# Patient Record
Sex: Female | Born: 1950 | Race: White | Hispanic: No | Marital: Married | Smoking: Never smoker
Health system: Southern US, Community
[De-identification: ages and names within clinical notes are randomized; demographics above are authoritative.]

## PROBLEM LIST (undated history)

## (undated) DIAGNOSIS — H269 Unspecified cataract: Secondary | ICD-10-CM

## (undated) DIAGNOSIS — E785 Hyperlipidemia, unspecified: Secondary | ICD-10-CM

## (undated) DIAGNOSIS — M8580 Other specified disorders of bone density and structure, unspecified site: Secondary | ICD-10-CM

## (undated) DIAGNOSIS — F329 Major depressive disorder, single episode, unspecified: Secondary | ICD-10-CM

## (undated) DIAGNOSIS — F32A Depression, unspecified: Secondary | ICD-10-CM

## (undated) DIAGNOSIS — D649 Anemia, unspecified: Secondary | ICD-10-CM

## (undated) DIAGNOSIS — T7840XA Allergy, unspecified, initial encounter: Secondary | ICD-10-CM

## (undated) HISTORY — DX: Other specified disorders of bone density and structure, unspecified site: M85.80

## (undated) HISTORY — PX: COLONOSCOPY: SHX174

## (undated) HISTORY — DX: Unspecified cataract: H26.9

## (undated) HISTORY — PX: DILATION AND CURETTAGE OF UTERUS: SHX78

## (undated) HISTORY — DX: Anemia, unspecified: D64.9

## (undated) HISTORY — DX: Allergy, unspecified, initial encounter: T78.40XA

## (undated) HISTORY — DX: Major depressive disorder, single episode, unspecified: F32.9

## (undated) HISTORY — DX: Depression, unspecified: F32.A

## (undated) HISTORY — DX: Hyperlipidemia, unspecified: E78.5

---

## 1999-04-21 ENCOUNTER — Ambulatory Visit (HOSPITAL_COMMUNITY): Admission: RE | Admit: 1999-04-21 | Discharge: 1999-04-21 | Payer: Self-pay | Admitting: Obstetrics and Gynecology

## 1999-04-21 ENCOUNTER — Encounter (INDEPENDENT_AMBULATORY_CARE_PROVIDER_SITE_OTHER): Payer: Self-pay

## 2000-02-11 ENCOUNTER — Other Ambulatory Visit: Admission: RE | Admit: 2000-02-11 | Discharge: 2000-02-11 | Payer: Self-pay | Admitting: Obstetrics & Gynecology

## 2001-03-08 ENCOUNTER — Other Ambulatory Visit: Admission: RE | Admit: 2001-03-08 | Discharge: 2001-03-08 | Payer: Self-pay | Admitting: Obstetrics & Gynecology

## 2002-03-09 ENCOUNTER — Other Ambulatory Visit: Admission: RE | Admit: 2002-03-09 | Discharge: 2002-03-09 | Payer: Self-pay | Admitting: Obstetrics & Gynecology

## 2003-03-19 ENCOUNTER — Other Ambulatory Visit: Admission: RE | Admit: 2003-03-19 | Discharge: 2003-03-19 | Payer: Self-pay | Admitting: Obstetrics & Gynecology

## 2006-04-13 ENCOUNTER — Ambulatory Visit: Payer: Self-pay | Admitting: Gastroenterology

## 2006-04-28 ENCOUNTER — Ambulatory Visit: Payer: Self-pay | Admitting: Gastroenterology

## 2007-01-25 ENCOUNTER — Encounter: Admission: RE | Admit: 2007-01-25 | Discharge: 2007-01-25 | Payer: Self-pay | Admitting: Obstetrics & Gynecology

## 2011-03-26 ENCOUNTER — Encounter: Payer: Self-pay | Admitting: Internal Medicine

## 2011-06-09 ENCOUNTER — Ambulatory Visit (AMBULATORY_SURGERY_CENTER): Payer: BC Managed Care – PPO | Admitting: *Deleted

## 2011-06-09 ENCOUNTER — Encounter: Payer: Self-pay | Admitting: Gastroenterology

## 2011-06-09 VITALS — Ht 63.0 in | Wt 141.0 lb

## 2011-06-09 DIAGNOSIS — Z1211 Encounter for screening for malignant neoplasm of colon: Secondary | ICD-10-CM

## 2011-06-09 MED ORDER — MOVIPREP 100 G PO SOLR: ORAL | Status: DC

## 2011-06-21 ENCOUNTER — Ambulatory Visit (AMBULATORY_SURGERY_CENTER): Payer: BC Managed Care – PPO | Admitting: Gastroenterology

## 2011-06-21 ENCOUNTER — Encounter: Payer: Self-pay | Admitting: Gastroenterology

## 2011-06-21 VITALS — BP 125/76 | HR 67 | Temp 97.3°F | Resp 18 | Ht 63.0 in | Wt 141.0 lb

## 2011-06-21 DIAGNOSIS — Z800 Family history of malignant neoplasm of digestive organs: Secondary | ICD-10-CM | POA: Insufficient documentation

## 2011-06-21 DIAGNOSIS — Z1211 Encounter for screening for malignant neoplasm of colon: Secondary | ICD-10-CM

## 2011-06-21 MED ORDER — SODIUM CHLORIDE 0.9 % IV SOLN: 500.0000 mL | INTRAVENOUS | Status: DC

## 2011-06-21 NOTE — Progress Notes (Signed)
Shon Hough, CRNA reported slight blood tinged saliva on the white towel on the side of the pt's mouth when brought into the recovery room.  I also assessed the pt's mouth and she had a small bite on the inside of her right cheek were the pt must had bitten in the procedure room.  No bleeding noted at this time and the pt denies any pain or discomfort.  Her husband was also notififed as well. Maw  No complaints noted in the recovery room. Maw  Patient did not experience any of the following events: a burn prior to discharge; a fall within the facility; wrong site/side/patient/procedure/implant event; or a hospital transfer or hospital admission upon discharge from the facility. [phone]) Patient did not have preoperative order for IV antibiotic SSI prophylaxis. [phone])   No bleeding or blood tinged mucous noted on discharge. Maw

## 2011-06-21 NOTE — Patient Instructions (Signed)
Resume your prior medications today.  Please call if any questions or concerns.   YOU HAD AN ENDOSCOPIC PROCEDURE TODAY AT THE [facility]: Refer to the procedure report that was given to you for any specific questions about what was found during the examination.  If the procedure report does not answer your questions, please call your gastroenterologist to clarify.  If you requested that your care partner not be given the details of your procedure findings, then the procedure report has been included in a sealed envelope for you to review at your convenience later.  YOU SHOULD EXPECT: Some feelings of bloating in the abdomen. Passage of more gas than usual.  Walking can help get rid of the air that was put into your GI tract during the procedure and reduce the bloating. If you had a lower endoscopy (such as a colonoscopy or flexible sigmoidoscopy) you may notice spotting of blood in your stool or on the toilet paper. If you underwent a bowel prep for your procedure, then you may not have a normal bowel movement for a few days.  DIET: Your first meal following the procedure should be a light meal and then it is ok to progress to your normal diet.  A half-sandwich or bowl of soup is an example of a good first meal.  Heavy or fried foods are harder to digest and may make you feel nauseous or bloated.  Likewise meals heavy in dairy and vegetables can cause extra gas to form and this can also increase the bloating.  Drink plenty of fluids but you should avoid alcoholic beverages for 24 hours.  ACTIVITY: Your care partner should take you home directly after the procedure.  You should plan to take it easy, moving slowly for the rest of the day.  You can resume normal activity the day after the procedure however you should NOT DRIVE or use heavy machinery for 24 hours (because of the sedation medicines used during the test).    SYMPTOMS TO REPORT IMMEDIATELY: A gastroenterologist can be reached at  any hour.  During normal business hours, 8:30 AM to 5:00 PM Monday through Friday, call [phone].  After hours and on weekends, please call the GI answering service at [phone] who will take a message and have the physician on call contact you.   Following lower endoscopy (colonoscopy or flexible sigmoidoscopy):  Excessive amounts of blood in the stool  Significant tenderness or worsening of abdominal pains  Swelling of the abdomen that is new, acute  Fever of 100F or higher    FOLLOW UP: If any biopsies were taken you will be contacted by phone or by letter within the next 1-3 weeks.  Call your gastroenterologist if you have not heard about the biopsies in 3 weeks.  Our staff will call the home number listed on your records the next business day following your procedure to check on you and address any questions or concerns that you may have at that time regarding the information given to you following your procedure. This is a courtesy call and so if there is no answer at the home number and we have not heard from you through the emergency physician on call, we will assume that you have returned to your regular daily activities without incident.  SIGNATURES/CONFIDENTIALITY: You and/or your care partner have signed paperwork which will be entered into your electronic medical record.  These signatures attest to the fact that that the information above on your   After Visit Summary has been reviewed and is understood.  Full responsibility of the confidentiality of this discharge information lies with you and/or your care-partner.  

## 2011-06-21 NOTE — Op Note (Signed)
[facility] [address] [place], [place]  [address]  COLONOSCOPY PROCEDURE REPORT  PATIENT:  Angela Hickman, Angela Hickman  MR#:  [id number] BIRTHDATE:  06/24/1950, 61 yrs. old  GENDER:  female ENDOSCOPIST:  Vania Rea. Jarold Motto, MD, [facility] REF. BY: PROCEDURE DATE:  06/21/2011 PROCEDURE:  Surveillance Colonoscopy ASA CLASS:  Class II INDICATIONS:  family history of colon cancer MEDICATIONS:   propofol (Diprivan) 160 mg IV  DESCRIPTION OF PROCEDURE:   After the risks and benefits and of the procedure were explained, informed consent was obtained. Digital rectal exam was performed and revealed no abnormalities. The LB PCF-[id number] endoscope was introduced through the anus and advanced to the cecum, which was identified by both the appendix and ileocecal valve.  The quality of the prep was excellent, using MoviPrep.  The instrument was then slowly withdrawn as the colon was fully examined. <<PROCEDUREIMAGES>>  FINDINGS:  No polyps or cancers were seen. very tortuous and redundant colon.hard exam  This was otherwise a normal examination of the colon.   Retroflexed views in the rectum revealed no abnormalities.    The scope was then withdrawn from the patient and the procedure completed.  COMPLICATIONS:  None ENDOSCOPIC IMPRESSION: 1) No polyps or cancers 2) Otherwise normal examination RECOMMENDATIONS: 1) Given your significant family history of colon cancer, you should have a repeat colonoscopy in 5 years  REPEAT EXAM:  No  ______________________________ Vania Rea. Jarold Motto, MD, [facility]  CC:  Shaune Pollack, MD  n. Rosalie Doctor:   Vania Rea. Trevonn Hallum at 06/21/2011 10:02 AM  [facility], Corrie Dandy, [id number]

## 2011-06-22 ENCOUNTER — Telehealth: Payer: Self-pay | Admitting: *Deleted

## 2011-06-22 NOTE — Telephone Encounter (Signed)
Called number given in admitting yesterday and no answer with no answering machine. ewm

## 2012-07-05 ENCOUNTER — Other Ambulatory Visit (HOSPITAL_COMMUNITY): Payer: Self-pay | Admitting: Urology

## 2012-07-05 DIAGNOSIS — N1330 Unspecified hydronephrosis: Secondary | ICD-10-CM

## 2012-08-15 ENCOUNTER — Encounter (HOSPITAL_COMMUNITY)
Admission: RE | Admit: 2012-08-15 | Discharge: 2012-08-15 | Disposition: A | Discharge status: Home / Self Care | Payer: BC Managed Care – PPO | Source: Ambulatory Visit | Attending: Urology | Admitting: Urology

## 2012-08-15 DIAGNOSIS — N1330 Unspecified hydronephrosis: Secondary | ICD-10-CM

## 2012-08-15 MED ORDER — FUROSEMIDE 10 MG/ML IJ SOLN
40.0000 mg | Freq: Once | INTRAMUSCULAR | Status: AC
  Administered 2012-08-15: 31 mg via INTRAVENOUS
  Filled 2012-08-15: qty 4

## 2012-08-15 MED ORDER — TECHNETIUM TC 99M MERTIATIDE
16.5000 | Freq: Once | INTRAVENOUS | Status: AC | PRN
  Administered 2012-08-15: 17 via INTRAVENOUS

## 2014-12-31 IMAGING — NM NM RENAL IMAGING FLOW W/ PHARM
2 series · 12 of 12 positions shown · non-contrast
Comparison: None.

CLINICAL DATA: Hydronephrosis.  Question obstruction of the left
ureter.

NUCLEAR MEDICINE RENAL SCAN WITH DIURETIC ADMINISTRATION
TECHNIQUE: Radionuclide angiographic and sequential renal images
were obtained after intravenous injection of radiopharmaceutical.
Imaging was continued during slow intravenous injection of Lasix
approximately 15 minutes after the start of the examination.
Radiopharmaceutical:  16.5 mCi Bc-ZZm MAG3

[Series 1: re renal qualitative · 9.51mm/px · 6 of 130 frames shown (1 of 2)]
[frame 11/130]
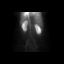
[frame 33/130]
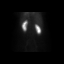
[frame 55/130]
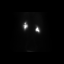
[frame 76/130]
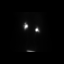
[frame 98/130]
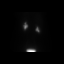
[frame 120/130]
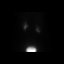

[Series 1: re renal qualitative · 9.51mm/px · 6 of 130 frames shown (2 of 2)]
[frame 11/130]
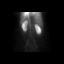
[frame 33/130]
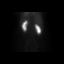
[frame 55/130]
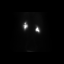
[frame 76/130]
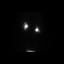
[frame 98/130]
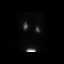
[frame 120/130]
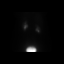

[12 of 12 positions shown; findings below may reference images not displayed]

FINDINGS: Perfusion images demonstrate early symmetric perfusion to
the kidneys.  Differential renal uptake at 2-3 minutes is 49% on
the right and 51% on the left.

Renogram images demonstrate normal renal cortical uptake and
excretion bilaterally.  Early excretory images demonstrate
symmetric activity in the renal pelves.  There is no ureteral
dilatation or retention of contrast within the collecting systems.
There is no evidence of ureteral obstruction.

Differential renal function is 49.1% right and 50.9% left.  Post
Lasix T [DATE] times are 8.5 minutes on the right and 7.0 minutes on
the left.
IMPRESSION: Normal examination.  No evidence of ureteral obstruction.  The
differential renal function is nearly symmetric.

## 2015-03-18 ENCOUNTER — Encounter: Payer: Self-pay | Admitting: Gastroenterology

## 2015-08-26 DIAGNOSIS — Z1231 Encounter for screening mammogram for malignant neoplasm of breast: Secondary | ICD-10-CM | POA: Diagnosis not present

## 2015-11-04 DIAGNOSIS — Z6826 Body mass index (BMI) 26.0-26.9, adult: Secondary | ICD-10-CM | POA: Diagnosis not present

## 2015-11-04 DIAGNOSIS — Z01419 Encounter for gynecological examination (general) (routine) without abnormal findings: Secondary | ICD-10-CM | POA: Diagnosis not present

## 2015-11-26 DIAGNOSIS — N3090 Cystitis, unspecified without hematuria: Secondary | ICD-10-CM | POA: Diagnosis not present

## 2015-12-01 DIAGNOSIS — E7800 Pure hypercholesterolemia, unspecified: Secondary | ICD-10-CM | POA: Diagnosis not present

## 2015-12-01 DIAGNOSIS — R310 Gross hematuria: Secondary | ICD-10-CM | POA: Diagnosis not present

## 2015-12-01 DIAGNOSIS — Z79899 Other long term (current) drug therapy: Secondary | ICD-10-CM | POA: Diagnosis not present

## 2015-12-22 DIAGNOSIS — N950 Postmenopausal bleeding: Secondary | ICD-10-CM | POA: Diagnosis not present

## 2016-03-29 ENCOUNTER — Encounter: Payer: Self-pay | Admitting: *Deleted

## 2016-05-12 ENCOUNTER — Encounter: Payer: Self-pay | Admitting: Gastroenterology

## 2016-08-30 DIAGNOSIS — Z1231 Encounter for screening mammogram for malignant neoplasm of breast: Secondary | ICD-10-CM | POA: Diagnosis not present

## 2016-09-30 DIAGNOSIS — R928 Other abnormal and inconclusive findings on diagnostic imaging of breast: Secondary | ICD-10-CM | POA: Diagnosis not present

## 2016-10-25 DIAGNOSIS — H2513 Age-related nuclear cataract, bilateral: Secondary | ICD-10-CM | POA: Diagnosis not present

## 2016-10-25 DIAGNOSIS — H5213 Myopia, bilateral: Secondary | ICD-10-CM | POA: Diagnosis not present

## 2016-10-25 DIAGNOSIS — H52203 Unspecified astigmatism, bilateral: Secondary | ICD-10-CM | POA: Diagnosis not present

## 2016-10-25 DIAGNOSIS — H524 Presbyopia: Secondary | ICD-10-CM | POA: Diagnosis not present

## 2016-10-27 DIAGNOSIS — L719 Rosacea, unspecified: Secondary | ICD-10-CM | POA: Diagnosis not present

## 2016-11-15 DIAGNOSIS — Z6826 Body mass index (BMI) 26.0-26.9, adult: Secondary | ICD-10-CM | POA: Diagnosis not present

## 2016-11-15 DIAGNOSIS — Z01419 Encounter for gynecological examination (general) (routine) without abnormal findings: Secondary | ICD-10-CM | POA: Diagnosis not present

## 2016-11-29 DIAGNOSIS — Z23 Encounter for immunization: Secondary | ICD-10-CM | POA: Diagnosis not present

## 2016-11-29 DIAGNOSIS — E7800 Pure hypercholesterolemia, unspecified: Secondary | ICD-10-CM | POA: Diagnosis not present

## 2016-11-29 DIAGNOSIS — Z5181 Encounter for therapeutic drug level monitoring: Secondary | ICD-10-CM | POA: Diagnosis not present

## 2016-12-14 ENCOUNTER — Ambulatory Visit (AMBULATORY_SURGERY_CENTER): Payer: Self-pay

## 2016-12-14 VITALS — Ht 62.5 in | Wt 149.0 lb

## 2016-12-14 DIAGNOSIS — Z800 Family history of malignant neoplasm of digestive organs: Secondary | ICD-10-CM

## 2016-12-14 MED ORDER — NA SULFATE-K SULFATE-MG SULF 17.5-3.13-1.6 GM/177ML PO SOLN: ORAL | 0 refills | Status: DC

## 2016-12-14 NOTE — Progress Notes (Signed)
Per pt, no allergies to soy or egg products.Pt not taking any weight loss meds or using  O2 at home.   Pt refused Emmi video. 

## 2016-12-15 DIAGNOSIS — M899 Disorder of bone, unspecified: Secondary | ICD-10-CM | POA: Diagnosis not present

## 2016-12-21 ENCOUNTER — Encounter: Payer: Self-pay | Admitting: Gastroenterology

## 2016-12-27 ENCOUNTER — Ambulatory Visit (AMBULATORY_SURGERY_CENTER): Payer: BLUE CROSS/BLUE SHIELD | Admitting: Gastroenterology

## 2016-12-27 ENCOUNTER — Encounter: Payer: Self-pay | Admitting: Gastroenterology

## 2016-12-27 VITALS — BP 118/71 | HR 68 | Temp 97.3°F | Resp 10 | Ht 62.5 in | Wt 149.0 lb

## 2016-12-27 DIAGNOSIS — D123 Benign neoplasm of transverse colon: Secondary | ICD-10-CM | POA: Diagnosis not present

## 2016-12-27 DIAGNOSIS — D125 Benign neoplasm of sigmoid colon: Secondary | ICD-10-CM

## 2016-12-27 DIAGNOSIS — Z1211 Encounter for screening for malignant neoplasm of colon: Secondary | ICD-10-CM

## 2016-12-27 DIAGNOSIS — Z800 Family history of malignant neoplasm of digestive organs: Secondary | ICD-10-CM | POA: Diagnosis not present

## 2016-12-27 DIAGNOSIS — D122 Benign neoplasm of ascending colon: Secondary | ICD-10-CM

## 2016-12-27 DIAGNOSIS — Z1212 Encounter for screening for malignant neoplasm of rectum: Secondary | ICD-10-CM | POA: Diagnosis not present

## 2016-12-27 MED ORDER — SODIUM CHLORIDE 0.9 % IV SOLN: 500.0000 mL | INTRAVENOUS | Status: DC

## 2016-12-27 NOTE — Progress Notes (Signed)
Report to PACU, RN, vss, BBS= Clear.  

## 2016-12-27 NOTE — Patient Instructions (Signed)
**   Handouts given on polyps and diverticulosis **   YOU HAD AN ENDOSCOPIC PROCEDURE TODAY AT THE [facility]:   Refer to the procedure report that was given to you for any specific questions about what was found during the examination.  If the procedure report does not answer your questions, please call your gastroenterologist to clarify.  If you requested that your care partner not be given the details of your procedure findings, then the procedure report has been included in a sealed envelope for you to review at your convenience later.  YOU SHOULD EXPECT: Some feelings of bloating in the abdomen. Passage of more gas than usual.  Walking can help get rid of the air that was put into your GI tract during the procedure and reduce the bloating. If you had a lower endoscopy (such as a colonoscopy or flexible sigmoidoscopy) you may notice spotting of blood in your stool or on the toilet paper. If you underwent a bowel prep for your procedure, you may not have a normal bowel movement for a few days.  Please Note:  You might notice some irritation and congestion in your nose or some drainage.  This is from the oxygen used during your procedure.  There is no need for concern and it should clear up in a day or so.  SYMPTOMS TO REPORT IMMEDIATELY:   Following lower endoscopy (colonoscopy or flexible sigmoidoscopy):  Excessive amounts of blood in the stool  Significant tenderness or worsening of abdominal pains  Swelling of the abdomen that is new, acute  Fever of 100F or higher  For urgent or emergent issues, a gastroenterologist can be reached at any hour by calling [phone].   DIET:  We do recommend a small meal at first, but then you may proceed to your regular diet.  Drink plenty of fluids but you should avoid alcoholic beverages for 24 hours.  ACTIVITY:  You should plan to take it easy for the rest of today and you should NOT DRIVE or use heavy machinery until tomorrow (because  of the sedation medicines used during the test).    FOLLOW UP: Our staff will call the number listed on your records the next business day following your procedure to check on you and address any questions or concerns that you may have regarding the information given to you following your procedure. If we do not reach you, we will leave a message.  However, if you are feeling well and you are not experiencing any problems, there is no need to return our call.  We will assume that you have returned to your regular daily activities without incident.  If any biopsies were taken you will be contacted by phone or by letter within the next 1-3 weeks.  Please call us at [phone] if you have not heard about the biopsies in 3 weeks.    SIGNATURES/CONFIDENTIALITY: You and/or your care partner have signed paperwork which will be entered into your electronic medical record.  These signatures attest to the fact that that the information above on your After Visit Summary has been reviewed and is understood.  Full responsibility of the confidentiality of this discharge information lies with you and/or your care-partner. 

## 2016-12-27 NOTE — Progress Notes (Signed)
Called to room to assist during endoscopic procedure.  Patient ID and intended procedure confirmed with present staff. Received instructions for my participation in the procedure from the performing physician.  

## 2016-12-27 NOTE — Op Note (Signed)
[facility] Patient Name: Angela Hickman Procedure Date: 12/27/2016 9:03 AM MRN: [id number] Endoscopist: Ladene Artist , MD Age: 66 Referring MD:  Date of Birth: 01/08/1951 Gender: Female Account #: [id number] Procedure:                Colonoscopy Indications:              Screening in patient at increased risk: Family                            history of 1st-degree relative with colorectal                            cancer Medicines:                Monitored Anesthesia Care Procedure:                Pre-Anesthesia Assessment:                           - Prior to the procedure, a History and Physical                            was performed, and patient medications and                            allergies were reviewed. The patient's tolerance of                            previous anesthesia was also reviewed. The risks                            and benefits of the procedure and the sedation                            options and risks were discussed with the patient.                            All questions were answered, and informed consent                            was obtained. Prior Anticoagulants: The patient has                            taken no previous anticoagulant or antiplatelet                            agents. ASA Grade Assessment: II - A patient with                            mild systemic disease. After reviewing the risks                            and benefits, the patient was deemed in  satisfactory condition to undergo the procedure.                           After obtaining informed consent, the colonoscope                            was passed under direct vision. Throughout the                            procedure, the patient's blood pressure, pulse, and                            oxygen saturations were monitored continuously. The                            Colonoscope was introduced through the anus and                       advanced to the the cecum, identified by                            appendiceal orifice and ileocecal valve. The                            ileocecal valve, appendiceal orifice, and rectum                            were photographed. The quality of the bowel                            preparation was excellent. The colonoscopy was                            performed without difficulty. The patient tolerated                            the procedure well. Scope In: 9:20:44 AM Scope Out: 9:38:52 AM Scope Withdrawal Time: 0 hours 15 minutes 17 seconds  Total Procedure Duration: 0 hours 18 minutes 8 seconds  Findings:                 The perianal and digital rectal examinations were                            normal.                           A 4 mm polyp was found in the transverse colon. The                            polyp was sessile. The polyp was removed with a                            cold biopsy forceps. Resection and retrieval were  complete.                           Two sessile polyps were found in the sigmoid colon                            and ascending colon. The polyps were 7 mm in size.                            These polyps were removed with a cold snare.                            Resection and retrieval were complete.                           A few scattered medium-mouthed diverticula were                            found in the descending colon and transverse colon.                           The exam was otherwise without abnormality on                            direct and retroflexion views. Complications:            No immediate complications. Estimated blood loss:                            None. Estimated Blood Loss:     Estimated blood loss: none. Impression:               - One 4 mm polyp in the transverse colon, removed                            with a cold biopsy forceps. Resected and retrieved.                            - Two 7 mm polyps in the sigmoid colon and in the                            ascending colon, removed with a cold snare.                            Resected and retrieved.                           - Diverticulosis in the descending colon and in the                            transverse colon.                           - The examination was otherwise normal on direct  and retroflexion views. Recommendation:           - Repeat colonoscopy in 5 years for surveillance.                           - Patient has a contact number available for                            emergencies. The signs and symptoms of potential                            delayed complications were discussed with the                            patient. Return to normal activities tomorrow.                            Written discharge instructions were provided to the                            patient.                           - High fiber diet.                           - Continue present medications.                           - Await pathology results. Ladene Artist, MD 12/27/2016 9:42:52 AM This report has been signed electronically.

## 2016-12-28 ENCOUNTER — Telehealth: Payer: Self-pay

## 2016-12-28 NOTE — Telephone Encounter (Signed)
Left message

## 2016-12-28 NOTE — Telephone Encounter (Signed)
  Follow up Call-  Call Angela Hickman number 12/27/2016  Post procedure Call Angela Hickman phone  # [phone]  Permission to leave phone message Yes  Some recent data might be hidden     Patient questions:  Do you have a fever, pain , or abdominal swelling? No. Pain Score  0 *  Have you tolerated food without any problems? Yes.    Have you been able to return to your normal activities? Yes.    Do you have any questions about your discharge instructions: Diet   No. Medications  No. Follow up visit  No.  Do you have questions or concerns about your Care? No.  Actions: * If pain score is 4 or above: No action needed, pain <4.

## 2017-01-06 ENCOUNTER — Encounter: Payer: Self-pay | Admitting: Gastroenterology

## 2017-03-15 DIAGNOSIS — B009 Herpesviral infection, unspecified: Secondary | ICD-10-CM | POA: Diagnosis not present

## 2017-03-15 DIAGNOSIS — L719 Rosacea, unspecified: Secondary | ICD-10-CM | POA: Diagnosis not present

## 2017-03-25 DIAGNOSIS — N644 Mastodynia: Secondary | ICD-10-CM | POA: Diagnosis not present

## 2017-09-28 DIAGNOSIS — Z1231 Encounter for screening mammogram for malignant neoplasm of breast: Secondary | ICD-10-CM | POA: Diagnosis not present

## 2017-11-30 DIAGNOSIS — E7800 Pure hypercholesterolemia, unspecified: Secondary | ICD-10-CM | POA: Diagnosis not present

## 2017-11-30 DIAGNOSIS — Z79899 Other long term (current) drug therapy: Secondary | ICD-10-CM | POA: Diagnosis not present

## 2017-12-14 DIAGNOSIS — Z01419 Encounter for gynecological examination (general) (routine) without abnormal findings: Secondary | ICD-10-CM | POA: Diagnosis not present

## 2017-12-14 DIAGNOSIS — Z9189 Other specified personal risk factors, not elsewhere classified: Secondary | ICD-10-CM | POA: Diagnosis not present

## 2017-12-14 DIAGNOSIS — Z124 Encounter for screening for malignant neoplasm of cervix: Secondary | ICD-10-CM | POA: Diagnosis not present

## 2017-12-14 DIAGNOSIS — Z1151 Encounter for screening for human papillomavirus (HPV): Secondary | ICD-10-CM | POA: Diagnosis not present

## 2017-12-14 DIAGNOSIS — Z6826 Body mass index (BMI) 26.0-26.9, adult: Secondary | ICD-10-CM | POA: Diagnosis not present

## 2017-12-14 DIAGNOSIS — Z113 Encounter for screening for infections with a predominantly sexual mode of transmission: Secondary | ICD-10-CM | POA: Diagnosis not present

## 2018-02-11 DIAGNOSIS — J029 Acute pharyngitis, unspecified: Secondary | ICD-10-CM | POA: Diagnosis not present

## 2018-02-15 DIAGNOSIS — N950 Postmenopausal bleeding: Secondary | ICD-10-CM | POA: Diagnosis not present

## 2018-02-15 DIAGNOSIS — B373 Candidiasis of vulva and vagina: Secondary | ICD-10-CM | POA: Diagnosis not present

## 2018-02-27 DIAGNOSIS — L910 Hypertrophic scar: Secondary | ICD-10-CM | POA: Diagnosis not present

## 2018-02-27 DIAGNOSIS — J069 Acute upper respiratory infection, unspecified: Secondary | ICD-10-CM | POA: Diagnosis not present

## 2018-03-08 DIAGNOSIS — N950 Postmenopausal bleeding: Secondary | ICD-10-CM | POA: Diagnosis not present

## 2018-11-01 DIAGNOSIS — Z1231 Encounter for screening mammogram for malignant neoplasm of breast: Secondary | ICD-10-CM | POA: Diagnosis not present

## 2018-12-04 DIAGNOSIS — M810 Age-related osteoporosis without current pathological fracture: Secondary | ICD-10-CM | POA: Diagnosis not present

## 2018-12-04 DIAGNOSIS — F3342 Major depressive disorder, recurrent, in full remission: Secondary | ICD-10-CM | POA: Diagnosis not present

## 2018-12-04 DIAGNOSIS — Z23 Encounter for immunization: Secondary | ICD-10-CM | POA: Diagnosis not present

## 2018-12-04 DIAGNOSIS — E7800 Pure hypercholesterolemia, unspecified: Secondary | ICD-10-CM | POA: Diagnosis not present

## 2019-01-02 DIAGNOSIS — Z124 Encounter for screening for malignant neoplasm of cervix: Secondary | ICD-10-CM | POA: Diagnosis not present

## 2019-01-02 DIAGNOSIS — Z01419 Encounter for gynecological examination (general) (routine) without abnormal findings: Secondary | ICD-10-CM | POA: Diagnosis not present

## 2019-01-02 DIAGNOSIS — Z6826 Body mass index (BMI) 26.0-26.9, adult: Secondary | ICD-10-CM | POA: Diagnosis not present

## 2019-02-08 DIAGNOSIS — H43811 Vitreous degeneration, right eye: Secondary | ICD-10-CM | POA: Diagnosis not present

## 2019-02-08 DIAGNOSIS — H524 Presbyopia: Secondary | ICD-10-CM | POA: Diagnosis not present

## 2019-02-08 DIAGNOSIS — H25813 Combined forms of age-related cataract, bilateral: Secondary | ICD-10-CM | POA: Diagnosis not present

## 2019-02-08 DIAGNOSIS — H5213 Myopia, bilateral: Secondary | ICD-10-CM | POA: Diagnosis not present

## 2019-02-08 DIAGNOSIS — H52223 Regular astigmatism, bilateral: Secondary | ICD-10-CM | POA: Diagnosis not present

## 2019-02-08 DIAGNOSIS — H43393 Other vitreous opacities, bilateral: Secondary | ICD-10-CM | POA: Diagnosis not present

## 2019-05-03 DIAGNOSIS — Z23 Encounter for immunization: Secondary | ICD-10-CM | POA: Diagnosis not present

## 2019-05-11 ENCOUNTER — Ambulatory Visit: Payer: BLUE CROSS/BLUE SHIELD

## 2019-05-18 ENCOUNTER — Ambulatory Visit: Payer: BLUE CROSS/BLUE SHIELD

## 2019-05-24 DIAGNOSIS — Z23 Encounter for immunization: Secondary | ICD-10-CM | POA: Diagnosis not present

## 2019-06-01 ENCOUNTER — Ambulatory Visit: Payer: BLUE CROSS/BLUE SHIELD

## 2019-11-06 DIAGNOSIS — Z1231 Encounter for screening mammogram for malignant neoplasm of breast: Secondary | ICD-10-CM | POA: Diagnosis not present

## 2019-11-06 DIAGNOSIS — M8589 Other specified disorders of bone density and structure, multiple sites: Secondary | ICD-10-CM | POA: Diagnosis not present

## 2019-11-06 DIAGNOSIS — Z780 Asymptomatic menopausal state: Secondary | ICD-10-CM | POA: Diagnosis not present

## 2019-12-13 DIAGNOSIS — M810 Age-related osteoporosis without current pathological fracture: Secondary | ICD-10-CM | POA: Diagnosis not present

## 2019-12-13 DIAGNOSIS — Z23 Encounter for immunization: Secondary | ICD-10-CM | POA: Diagnosis not present

## 2019-12-13 DIAGNOSIS — E7800 Pure hypercholesterolemia, unspecified: Secondary | ICD-10-CM | POA: Diagnosis not present

## 2019-12-13 DIAGNOSIS — F3342 Major depressive disorder, recurrent, in full remission: Secondary | ICD-10-CM | POA: Diagnosis not present

## 2020-02-12 DIAGNOSIS — H5213 Myopia, bilateral: Secondary | ICD-10-CM | POA: Diagnosis not present

## 2020-02-12 DIAGNOSIS — H524 Presbyopia: Secondary | ICD-10-CM | POA: Diagnosis not present

## 2020-02-12 DIAGNOSIS — H43393 Other vitreous opacities, bilateral: Secondary | ICD-10-CM | POA: Diagnosis not present

## 2020-02-12 DIAGNOSIS — H52223 Regular astigmatism, bilateral: Secondary | ICD-10-CM | POA: Diagnosis not present

## 2020-02-12 DIAGNOSIS — H25813 Combined forms of age-related cataract, bilateral: Secondary | ICD-10-CM | POA: Diagnosis not present

## 2020-02-12 DIAGNOSIS — H43813 Vitreous degeneration, bilateral: Secondary | ICD-10-CM | POA: Diagnosis not present

## 2020-02-14 DIAGNOSIS — Z01419 Encounter for gynecological examination (general) (routine) without abnormal findings: Secondary | ICD-10-CM | POA: Diagnosis not present

## 2020-02-14 DIAGNOSIS — Z6825 Body mass index (BMI) 25.0-25.9, adult: Secondary | ICD-10-CM | POA: Diagnosis not present

## 2020-04-26 ENCOUNTER — Ambulatory Visit
Admission: EM | Admit: 2020-04-26 | Discharge: 2020-04-26 | Disposition: A | Discharge status: Home / Self Care | Payer: BLUE CROSS/BLUE SHIELD

## 2020-04-26 ENCOUNTER — Other Ambulatory Visit: Payer: Self-pay

## 2020-11-06 DIAGNOSIS — Z1231 Encounter for screening mammogram for malignant neoplasm of breast: Secondary | ICD-10-CM | POA: Diagnosis not present

## 2021-01-05 DIAGNOSIS — E7800 Pure hypercholesterolemia, unspecified: Secondary | ICD-10-CM | POA: Diagnosis not present

## 2021-01-05 DIAGNOSIS — M810 Age-related osteoporosis without current pathological fracture: Secondary | ICD-10-CM | POA: Diagnosis not present

## 2021-01-05 DIAGNOSIS — E559 Vitamin D deficiency, unspecified: Secondary | ICD-10-CM | POA: Diagnosis not present

## 2021-01-05 DIAGNOSIS — Z23 Encounter for immunization: Secondary | ICD-10-CM | POA: Diagnosis not present

## 2021-01-08 DIAGNOSIS — E559 Vitamin D deficiency, unspecified: Secondary | ICD-10-CM | POA: Diagnosis not present

## 2021-01-08 DIAGNOSIS — E7800 Pure hypercholesterolemia, unspecified: Secondary | ICD-10-CM | POA: Diagnosis not present

## 2021-01-08 DIAGNOSIS — Z0000 Encounter for general adult medical examination without abnormal findings: Secondary | ICD-10-CM | POA: Diagnosis not present

## 2021-01-08 DIAGNOSIS — M810 Age-related osteoporosis without current pathological fracture: Secondary | ICD-10-CM | POA: Diagnosis not present

## 2021-04-03 DIAGNOSIS — Z1371 Encounter for nonprocreative screening for genetic disease carrier status: Secondary | ICD-10-CM | POA: Diagnosis not present

## 2021-04-03 DIAGNOSIS — Z01419 Encounter for gynecological examination (general) (routine) without abnormal findings: Secondary | ICD-10-CM | POA: Diagnosis not present

## 2021-04-03 DIAGNOSIS — Z124 Encounter for screening for malignant neoplasm of cervix: Secondary | ICD-10-CM | POA: Diagnosis not present

## 2021-04-03 DIAGNOSIS — Z87898 Personal history of other specified conditions: Secondary | ICD-10-CM | POA: Diagnosis not present

## 2021-04-03 DIAGNOSIS — Z01411 Encounter for gynecological examination (general) (routine) with abnormal findings: Secondary | ICD-10-CM | POA: Diagnosis not present

## 2021-06-03 DIAGNOSIS — H25813 Combined forms of age-related cataract, bilateral: Secondary | ICD-10-CM | POA: Diagnosis not present

## 2021-06-03 DIAGNOSIS — H43393 Other vitreous opacities, bilateral: Secondary | ICD-10-CM | POA: Diagnosis not present

## 2021-06-03 DIAGNOSIS — H43813 Vitreous degeneration, bilateral: Secondary | ICD-10-CM | POA: Diagnosis not present

## 2021-06-26 DIAGNOSIS — B3731 Acute candidiasis of vulva and vagina: Secondary | ICD-10-CM | POA: Diagnosis not present

## 2021-06-26 DIAGNOSIS — N898 Other specified noninflammatory disorders of vagina: Secondary | ICD-10-CM | POA: Diagnosis not present

## 2021-07-31 DIAGNOSIS — N3001 Acute cystitis with hematuria: Secondary | ICD-10-CM | POA: Diagnosis not present

## 2021-08-14 DIAGNOSIS — H524 Presbyopia: Secondary | ICD-10-CM | POA: Diagnosis not present

## 2021-08-25 DIAGNOSIS — R3129 Other microscopic hematuria: Secondary | ICD-10-CM | POA: Diagnosis not present

## 2021-11-09 DIAGNOSIS — Z1231 Encounter for screening mammogram for malignant neoplasm of breast: Secondary | ICD-10-CM | POA: Diagnosis not present

## 2021-12-02 DIAGNOSIS — H903 Sensorineural hearing loss, bilateral: Secondary | ICD-10-CM | POA: Diagnosis not present

## 2021-12-10 DIAGNOSIS — M8589 Other specified disorders of bone density and structure, multiple sites: Secondary | ICD-10-CM | POA: Diagnosis not present

## 2021-12-10 DIAGNOSIS — M8588 Other specified disorders of bone density and structure, other site: Secondary | ICD-10-CM | POA: Diagnosis not present

## 2021-12-10 DIAGNOSIS — M85852 Other specified disorders of bone density and structure, left thigh: Secondary | ICD-10-CM | POA: Diagnosis not present

## 2021-12-29 DIAGNOSIS — R350 Frequency of micturition: Secondary | ICD-10-CM | POA: Diagnosis not present

## 2021-12-29 DIAGNOSIS — R351 Nocturia: Secondary | ICD-10-CM | POA: Diagnosis not present

## 2021-12-29 DIAGNOSIS — R3121 Asymptomatic microscopic hematuria: Secondary | ICD-10-CM | POA: Diagnosis not present

## 2022-01-29 DIAGNOSIS — E7800 Pure hypercholesterolemia, unspecified: Secondary | ICD-10-CM | POA: Diagnosis not present

## 2022-01-29 DIAGNOSIS — M810 Age-related osteoporosis without current pathological fracture: Secondary | ICD-10-CM | POA: Diagnosis not present

## 2022-01-29 DIAGNOSIS — E559 Vitamin D deficiency, unspecified: Secondary | ICD-10-CM | POA: Diagnosis not present

## 2022-01-29 DIAGNOSIS — Z0000 Encounter for general adult medical examination without abnormal findings: Secondary | ICD-10-CM | POA: Diagnosis not present

## 2022-02-02 ENCOUNTER — Encounter: Payer: Self-pay | Admitting: Gastroenterology

## 2022-02-02 DIAGNOSIS — E559 Vitamin D deficiency, unspecified: Secondary | ICD-10-CM | POA: Diagnosis not present

## 2022-02-02 DIAGNOSIS — E7800 Pure hypercholesterolemia, unspecified: Secondary | ICD-10-CM | POA: Diagnosis not present

## 2022-02-02 DIAGNOSIS — Z79899 Other long term (current) drug therapy: Secondary | ICD-10-CM | POA: Diagnosis not present

## 2022-02-10 ENCOUNTER — Other Ambulatory Visit: Payer: Self-pay

## 2022-02-10 ENCOUNTER — Ambulatory Visit (AMBULATORY_SURGERY_CENTER): Payer: Self-pay

## 2022-02-10 VITALS — Ht 62.5 in | Wt 135.0 lb

## 2022-02-10 DIAGNOSIS — Z86010 Personal history of colonic polyps: Secondary | ICD-10-CM

## 2022-02-10 MED ORDER — NA SULFATE-K SULFATE-MG SULF 17.5-3.13-1.6 GM/177ML PO SOLN: 1.0000 | Freq: Once | ORAL | 0 refills | Status: AC

## 2022-02-10 NOTE — Progress Notes (Signed)
Denies allergies to eggs or soy products. Denies complication of anesthesia or sedation. Denies use of weight loss medication. Denies use of O2.   Emmi instructions given for colonoscopy.  

## 2022-02-16 DIAGNOSIS — L719 Rosacea, unspecified: Secondary | ICD-10-CM | POA: Diagnosis not present

## 2022-03-15 ENCOUNTER — Encounter: Payer: Self-pay | Admitting: Certified Registered Nurse Anesthetist

## 2022-03-17 ENCOUNTER — Encounter: Payer: Self-pay | Admitting: Gastroenterology

## 2022-03-21 ENCOUNTER — Encounter: Payer: Self-pay | Admitting: Certified Registered Nurse Anesthetist

## 2022-03-22 ENCOUNTER — Encounter: Payer: Self-pay | Admitting: Gastroenterology

## 2022-03-22 ENCOUNTER — Ambulatory Visit (AMBULATORY_SURGERY_CENTER): Payer: Medicare Other | Admitting: Gastroenterology

## 2022-03-22 VITALS — BP 123/73 | HR 87 | Temp 97.8°F | Resp 16 | Ht 62.5 in | Wt 135.0 lb

## 2022-03-22 DIAGNOSIS — Z09 Encounter for follow-up examination after completed treatment for conditions other than malignant neoplasm: Secondary | ICD-10-CM

## 2022-03-22 DIAGNOSIS — D124 Benign neoplasm of descending colon: Secondary | ICD-10-CM | POA: Diagnosis not present

## 2022-03-22 DIAGNOSIS — D123 Benign neoplasm of transverse colon: Secondary | ICD-10-CM | POA: Diagnosis not present

## 2022-03-22 DIAGNOSIS — D122 Benign neoplasm of ascending colon: Secondary | ICD-10-CM | POA: Diagnosis not present

## 2022-03-22 DIAGNOSIS — Z86010 Personal history of colonic polyps: Secondary | ICD-10-CM | POA: Diagnosis not present

## 2022-03-22 DIAGNOSIS — Z800 Family history of malignant neoplasm of digestive organs: Secondary | ICD-10-CM | POA: Diagnosis not present

## 2022-03-22 MED ORDER — SODIUM CHLORIDE 0.9 % IV SOLN: 500.0000 mL | Freq: Once | INTRAVENOUS | Status: DC

## 2022-03-22 NOTE — Progress Notes (Signed)
Report given to PACU, vss 

## 2022-03-22 NOTE — Patient Instructions (Signed)
Thank you for coming in to see [place] today! Resume your diet and medications today, return to regular daily activities tomorrow. Next colonoscopy date to be determined after biopsy results final, approximately 1-2 weeks.   YOU HAD AN ENDOSCOPIC PROCEDURE TODAY AT [facility]:   Refer to the procedure report that was given to you for any specific questions about what was found during the examination.  If the procedure report does not answer your questions, please call your gastroenterologist to clarify.  If you requested that your care partner not be given the details of your procedure findings, then the procedure report has been included in a sealed envelope for you to review at your convenience later.  YOU SHOULD EXPECT: Some feelings of bloating in the abdomen. Passage of more gas than usual.  Walking can help get rid of the air that was put into your GI tract during the procedure and reduce the bloating. If you had a lower endoscopy (such as a colonoscopy or flexible sigmoidoscopy) you may notice spotting of blood in your stool or on the toilet paper. If you underwent a bowel prep for your procedure, you may not have a normal bowel movement for a few days.  Please Note:  You might notice some irritation and congestion in your nose or some drainage.  This is from the oxygen used during your procedure.  There is no need for concern and it should clear up in a day or so.  SYMPTOMS TO REPORT IMMEDIATELY:  Following lower endoscopy (colonoscopy or flexible sigmoidoscopy):  Excessive amounts of blood in the stool  Significant tenderness or worsening of abdominal pains  Swelling of the abdomen that is new, acute  Fever of 100F or higher   For urgent or emergent issues, a gastroenterologist can be reached at any hour by calling [phone]. Do not use MyChart messaging for urgent concerns.    DIET:  We do recommend a small meal at first, but then you may proceed to your regular  diet.  Drink plenty of fluids but you should avoid alcoholic beverages for 24 hours.  ACTIVITY:  You should plan to take it easy for the rest of today and you should NOT DRIVE or use heavy machinery until tomorrow (because of the sedation medicines used during the test).    FOLLOW UP: Our staff will call the number listed on your records the next business day following your procedure.  We will call around 7:15- 8:00 am to check on you and address any questions or concerns that you may have regarding the information given to you following your procedure. If we do not reach you, we will leave a message.     If any biopsies were taken you will be contacted by phone or by letter within the next 1-3 weeks.  Please call us at [phone] if you have not heard about the biopsies in 3 weeks.    SIGNATURES/CONFIDENTIALITY: You and/or your care partner have signed paperwork which will be entered into your electronic medical record.  These signatures attest to the fact that that the information above on your After Visit Summary has been reviewed and is understood.  Full responsibility of the confidentiality of this discharge information lies with you and/or your care-partner.

## 2022-03-22 NOTE — Progress Notes (Signed)
0835 Patient experiencing nausea and retching.  MD updated and Zofran 4 mg IV given.  Robinul 0.2 mg IV given due large amount of secretions upon assessment.  MD made aware, vss

## 2022-03-22 NOTE — Op Note (Signed)
[facility] Patient Name: Angela Hickman Procedure Date: 03/22/2022 8:16 AM MRN: [id number] Endoscopist: Ladene Artist , MD, [id number] Age: 71 Referring MD:  Date of Birth: 05-Dec-1950 Gender: Female Account #: [id number] Procedure:                Colonoscopy Indications:              Surveillance: Personal history of adenomatous                            polyps on last colonoscopy 5 years ago, Family                            history of colon cancer, 1st-degree relative Medicines:                Monitored Anesthesia Care Procedure:                Pre-Anesthesia Assessment:                           - Prior to the procedure, a History and Physical                            was performed, and patient medications and                            allergies were reviewed. The patient's tolerance of                            previous anesthesia was also reviewed. The risks                            and benefits of the procedure and the sedation                            options and risks were discussed with the patient.                            All questions were answered, and informed consent                            was obtained. Prior Anticoagulants: The patient has                            taken no anticoagulant or antiplatelet agents. ASA                            Grade Assessment: II - A patient with mild systemic                            disease. After reviewing the risks and benefits,                            the patient was deemed in satisfactory condition to  undergo the procedure.                           After obtaining informed consent, the colonoscope                            was passed under direct vision. Throughout the                            procedure, the patient's blood pressure, pulse, and                            oxygen saturations were monitored continuously. The                            Colonoscope  was introduced through the anus and                            advanced to the the cecum, identified by                            appendiceal orifice and ileocecal valve. The                            ileocecal valve, appendiceal orifice, and rectum                            were photographed. The quality of the bowel                            preparation was good. The colonoscopy was performed                            without difficulty. The patient tolerated the                            procedure well. Scope In: 8:24:49 AM Scope Out: 8:41:25 AM Scope Withdrawal Time: 0 hours 12 minutes 6 seconds  Total Procedure Duration: 0 hours 16 minutes 36 seconds  Findings:                 The perianal and digital rectal examinations were                            normal.                           Five sessile polyps were found in the descending                            colon (1), transverse colon (2) and ascending colon                            (2). The polyps were 6 to 8 mm in size. These  polyps were removed with a cold snare. Resection                            and retrieval were complete.                           A few small-mouthed diverticula were found in the                            descending colon and transverse colon. There was no                            evidence of diverticular bleeding.                           The exam was otherwise without abnormality on                            direct and retroflexion views. Complications:            No immediate complications. Estimated blood loss:                            None. Estimated Blood Loss:     Estimated blood loss: none. Impression:               - Five 6 to 8 mm polyps in the descending colon, in                            the transverse colon and in the ascending colon,                            removed with a cold snare. Resected and retrieved.                           - Mild  diverticulosis in descending and transverse                            colon.                           - The examination was otherwise normal on direct                            and retroflexion views. Recommendation:           - Repeat colonoscopy, likely 3 years, after studies                            are complete for surveillance based on pathology                            results.                           - Patient has a contact number available for  emergencies. The signs and symptoms of potential                            delayed complications were discussed with the                            patient. Return to normal activities tomorrow.                            Written discharge instructions were provided to the                            patient.                           - Resume previous diet.                           - Continue present medications.                           - Await pathology results. Ladene Artist, MD 03/22/2022 8:47:15 AM This report has been signed electronically.

## 2022-03-22 NOTE — Progress Notes (Signed)
History & Physical  Primary Care Physician:  Darcus Austin, MD (Inactive) Primary Gastroenterologist: Lucio Edward, MD  CHIEF COMPLAINT:  Personal history of colon polyps, [facility]   HPI: Angela Hickman is a 71 y.o. female with a personal history of adenomatous colon polyps and family history of colon cancer, first-degree relative, for colonoscopy.   Past Medical History:  Diagnosis Date   Allergy    Anemia    Cataract    Depression    Hyperlipidemia    Osteopenia    Osteopenia     Past Surgical History:  Procedure Laterality Date   CESAREAN SECTION     x2   COLONOSCOPY     DILATION AND CURETTAGE OF UTERUS     3 times    Prior to Admission medications   Medication Sig Start Date End Date Taking? Authorizing Provider  calcium carbonate 200 MG capsule Take 250 mg by mouth daily.   Yes [provider]  conjugated estrogens (PREMARIN) vaginal cream Place vaginally. Twice a week   Yes [provider]  fish oil-omega-3 fatty acids 1000 MG capsule Take 2 g by mouth daily.   Yes [provider]  fluticasone (FLONASE) 50 MCG/ACT nasal spray Place into both nostrils as needed for allergies or rhinitis.   Yes [provider]  Multiple Vitamins-Minerals (MULTIVITAMIN WITH MINERALS) tablet Take 1 tablet by mouth daily.   Yes [provider]  rosuvastatin (CRESTOR) 20 MG tablet Take 20 mg by mouth daily.   Yes [provider]  sertraline (ZOLOFT) 50 MG tablet Take 50 mg by mouth daily.   Yes [provider]  cetirizine (ZYRTEC) 10 MG chewable tablet Chew 10 mg by mouth as needed for allergies.    [provider]  fluticasone (FLONASE) 50 MCG/ACT nasal spray Place into the nose.    [provider]  ibandronate (BONIVA) 150 MG tablet Take 150 mg by mouth every 30 (thirty) days. Take in the morning with a full glass of water, on an empty stomach, and do not take anything else by mouth or lie down for the next  30 min.    [provider]  ketotifen (ZADITOR) 0.025 % ophthalmic solution Place 1 drop into both eyes daily before breakfast.    [provider]    Current Outpatient Medications  Medication Sig Dispense Refill   calcium carbonate 200 MG capsule Take 250 mg by mouth daily.     conjugated estrogens (PREMARIN) vaginal cream Place vaginally. Twice a week     fish oil-omega-3 fatty acids 1000 MG capsule Take 2 g by mouth daily.     fluticasone (FLONASE) 50 MCG/ACT nasal spray Place into both nostrils as needed for allergies or rhinitis.     Multiple Vitamins-Minerals (MULTIVITAMIN WITH MINERALS) tablet Take 1 tablet by mouth daily.     rosuvastatin (CRESTOR) 20 MG tablet Take 20 mg by mouth daily.     sertraline (ZOLOFT) 50 MG tablet Take 50 mg by mouth daily.     cetirizine (ZYRTEC) 10 MG chewable tablet Chew 10 mg by mouth as needed for allergies.     fluticasone (FLONASE) 50 MCG/ACT nasal spray Place into the nose.     ibandronate (BONIVA) 150 MG tablet Take 150 mg by mouth every 30 (thirty) days. Take in the morning with a full glass of water, on an empty stomach, and do not take anything else by mouth or lie down for the next 30 min.  ketotifen (ZADITOR) 0.025 % ophthalmic solution Place 1 drop into both eyes daily before breakfast.     Current Facility-Administered Medications  Medication Dose Route Frequency Provider Last Rate Last Admin   0.9 %  sodium chloride infusion  500 mL Intravenous Once Ladene Artist, MD        Allergies as of 03/22/2022 - Review Complete 03/22/2022  Allergen Reaction Noted   Sumycin [tetracycline hcl] Hives 06/09/2011   Erythromycin Other (See Comments) 06/09/2011    Family History  Problem Relation Age of Onset   Colon cancer Mother    DES usage Mother    Heart disease Father    Esophageal cancer Neg Hx    Stomach cancer Neg Hx    Rectal cancer Neg Hx     Social History   Socioeconomic History   Marital status: Married     Spouse name: Not on file   Number of children: Not on file   Years of education: Not on file   Highest education level: Not on file  Occupational History   Not on file  Tobacco Use   Smoking status: Never   Smokeless tobacco: Never  Vaping Use   Vaping Use: Never used  Substance and Sexual Activity   Alcohol use: Yes    Alcohol/week: 6.0 standard drinks of alcohol    Types: 6 Cans of beer per week    Comment: occasional   Drug use: No   Sexual activity: Not on file  Other Topics Concern   Not on file  Social History Narrative   Not on file   Social Determinants of Health   Financial Resource Strain: Not on file  Food Insecurity: Not on file  Transportation Needs: Not on file  Physical Activity: Not on file  Stress: Not on file  Social Connections: Not on file  Intimate Partner Violence: Not on file    Review of Systems:  All systems reviewed were negative except where noted in HPI.   Physical Exam: General:  Alert, well-developed, in NAD Head:  Normocephalic and atraumatic. Eyes:  Sclera clear, no icterus.   Conjunctiva pink. Ears:  Normal auditory acuity. Mouth:  No deformity or lesions.  Neck:  Supple; no masses . Lungs:  Clear throughout to auscultation.   No wheezes, crackles, or rhonchi. No acute distress. Heart:  Regular rate and rhythm; no murmurs. Abdomen:  Soft, nondistended, nontender. No masses, hepatomegaly. No obvious masses.  Normal bowel .    Rectal:  Deferred   Msk:  Symmetrical without gross deformities.. Pulses:  Normal pulses noted. Extremities:  Without edema. Neurologic:  Alert and  oriented x4;  grossly normal neurologically. Skin:  Intact without significant lesions or rashes. Psych:  Alert and cooperative. Normal mood and affect.   Impression / Plan:   Coronary  Angela Hickman T. Fuller Plan  03/22/2022, 8:10 AM See Shea Evans, [facility], to contact our on call provider

## 2022-03-23 ENCOUNTER — Telehealth: Payer: Self-pay | Admitting: *Deleted

## 2022-03-23 NOTE — Telephone Encounter (Signed)
  Follow up Call-     03/22/2022    7:55 AM 03/22/2022    7:46 AM  Call back number  Post procedure Call Back phone  # [phone]   Permission to leave phone message  Yes     Patient questions:  Do you have a fever, pain , or abdominal swelling? No. Pain Score  0 *  Have you tolerated food without any problems? Yes.    Have you been able to return to your normal activities? Yes.    Do you have any questions about your discharge instructions: Diet   No. Medications  No. Follow up visit  No.  Do you have questions or concerns about your Care? No.  Actions: * If pain score is 4 or above: No action needed, pain <4.

## 2022-04-08 ENCOUNTER — Encounter: Payer: Self-pay | Admitting: Gastroenterology

## 2022-04-15 DIAGNOSIS — N952 Postmenopausal atrophic vaginitis: Secondary | ICD-10-CM | POA: Diagnosis not present

## 2022-04-15 DIAGNOSIS — M8580 Other specified disorders of bone density and structure, unspecified site: Secondary | ICD-10-CM | POA: Diagnosis not present

## 2022-04-15 DIAGNOSIS — Z124 Encounter for screening for malignant neoplasm of cervix: Secondary | ICD-10-CM | POA: Diagnosis not present

## 2022-04-15 DIAGNOSIS — Z01419 Encounter for gynecological examination (general) (routine) without abnormal findings: Secondary | ICD-10-CM | POA: Diagnosis not present

## 2022-05-04 DIAGNOSIS — R87615 Unsatisfactory cytologic smear of cervix: Secondary | ICD-10-CM | POA: Diagnosis not present

## 2022-06-02 DIAGNOSIS — L821 Other seborrheic keratosis: Secondary | ICD-10-CM | POA: Diagnosis not present

## 2022-06-02 DIAGNOSIS — L578 Other skin changes due to chronic exposure to nonionizing radiation: Secondary | ICD-10-CM | POA: Diagnosis not present

## 2022-06-02 DIAGNOSIS — L814 Other melanin hyperpigmentation: Secondary | ICD-10-CM | POA: Diagnosis not present

## 2022-06-02 DIAGNOSIS — D1801 Hemangioma of skin and subcutaneous tissue: Secondary | ICD-10-CM | POA: Diagnosis not present

## 2022-10-19 DIAGNOSIS — F3341 Major depressive disorder, recurrent, in partial remission: Secondary | ICD-10-CM | POA: Diagnosis not present

## 2022-11-11 DIAGNOSIS — Z1231 Encounter for screening mammogram for malignant neoplasm of breast: Secondary | ICD-10-CM | POA: Diagnosis not present

## 2022-11-11 DIAGNOSIS — H524 Presbyopia: Secondary | ICD-10-CM | POA: Diagnosis not present

## 2023-02-03 DIAGNOSIS — Z0000 Encounter for general adult medical examination without abnormal findings: Secondary | ICD-10-CM | POA: Diagnosis not present

## 2023-02-03 DIAGNOSIS — Z79899 Other long term (current) drug therapy: Secondary | ICD-10-CM | POA: Diagnosis not present

## 2023-02-03 DIAGNOSIS — M810 Age-related osteoporosis without current pathological fracture: Secondary | ICD-10-CM | POA: Diagnosis not present

## 2023-02-03 DIAGNOSIS — E559 Vitamin D deficiency, unspecified: Secondary | ICD-10-CM | POA: Diagnosis not present

## 2023-02-03 DIAGNOSIS — E7800 Pure hypercholesterolemia, unspecified: Secondary | ICD-10-CM | POA: Diagnosis not present

## 2023-03-16 DIAGNOSIS — L209 Atopic dermatitis, unspecified: Secondary | ICD-10-CM | POA: Diagnosis not present

## 2023-04-07 DIAGNOSIS — J0100 Acute maxillary sinusitis, unspecified: Secondary | ICD-10-CM | POA: Diagnosis not present

## 2023-04-18 DIAGNOSIS — Z1331 Encounter for screening for depression: Secondary | ICD-10-CM | POA: Diagnosis not present

## 2023-04-18 DIAGNOSIS — Z124 Encounter for screening for malignant neoplasm of cervix: Secondary | ICD-10-CM | POA: Diagnosis not present

## 2023-04-18 DIAGNOSIS — Z01419 Encounter for gynecological examination (general) (routine) without abnormal findings: Secondary | ICD-10-CM | POA: Diagnosis not present

## 2023-04-25 DIAGNOSIS — E7800 Pure hypercholesterolemia, unspecified: Secondary | ICD-10-CM | POA: Diagnosis not present

## 2023-09-09 DIAGNOSIS — E7800 Pure hypercholesterolemia, unspecified: Secondary | ICD-10-CM | POA: Diagnosis not present

## 2023-09-12 ENCOUNTER — Other Ambulatory Visit (HOSPITAL_COMMUNITY): Payer: Self-pay | Admitting: Family Medicine

## 2023-09-12 DIAGNOSIS — E7800 Pure hypercholesterolemia, unspecified: Secondary | ICD-10-CM

## 2023-09-21 ENCOUNTER — Other Ambulatory Visit (HOSPITAL_COMMUNITY)

## 2023-10-04 ENCOUNTER — Other Ambulatory Visit (HOSPITAL_COMMUNITY)

## 2023-10-07 ENCOUNTER — Ambulatory Visit (HOSPITAL_BASED_OUTPATIENT_CLINIC_OR_DEPARTMENT_OTHER)
Admission: RE | Admit: 2023-10-07 | Discharge: 2023-10-07 | Disposition: A | Discharge status: Home / Self Care | Payer: Self-pay | Source: Ambulatory Visit | Attending: Family Medicine

## 2023-10-07 DIAGNOSIS — E7800 Pure hypercholesterolemia, unspecified: Secondary | ICD-10-CM | POA: Insufficient documentation

## 2023-11-15 DIAGNOSIS — H25813 Combined forms of age-related cataract, bilateral: Secondary | ICD-10-CM | POA: Diagnosis not present

## 2023-11-15 DIAGNOSIS — H43813 Vitreous degeneration, bilateral: Secondary | ICD-10-CM | POA: Diagnosis not present

## 2023-11-15 DIAGNOSIS — H5213 Myopia, bilateral: Secondary | ICD-10-CM | POA: Diagnosis not present

## 2023-11-15 DIAGNOSIS — H524 Presbyopia: Secondary | ICD-10-CM | POA: Diagnosis not present

## 2023-11-17 DIAGNOSIS — R92333 Mammographic heterogeneous density, bilateral breasts: Secondary | ICD-10-CM | POA: Diagnosis not present

## 2023-11-17 DIAGNOSIS — Z1231 Encounter for screening mammogram for malignant neoplasm of breast: Secondary | ICD-10-CM | POA: Diagnosis not present

## 2023-12-08 ENCOUNTER — Encounter (HOSPITAL_BASED_OUTPATIENT_CLINIC_OR_DEPARTMENT_OTHER): Payer: Self-pay | Admitting: Internal Medicine

## 2023-12-08 ENCOUNTER — Ambulatory Visit (HOSPITAL_BASED_OUTPATIENT_CLINIC_OR_DEPARTMENT_OTHER): Admitting: Internal Medicine

## 2023-12-08 VITALS — BP 94/68 | HR 79 | Ht 62.0 in | Wt 128.6 lb

## 2023-12-08 DIAGNOSIS — R931 Abnormal findings on diagnostic imaging of heart and coronary circulation: Secondary | ICD-10-CM | POA: Diagnosis not present

## 2023-12-08 DIAGNOSIS — Z79899 Other long term (current) drug therapy: Secondary | ICD-10-CM

## 2023-12-08 DIAGNOSIS — Z8249 Family history of ischemic heart disease and other diseases of the circulatory system: Secondary | ICD-10-CM | POA: Diagnosis not present

## 2023-12-08 DIAGNOSIS — E785 Hyperlipidemia, unspecified: Secondary | ICD-10-CM

## 2023-12-08 MED ORDER — ASPIRIN 81 MG PO TBEC: 81.0000 mg | DELAYED_RELEASE_TABLET | Freq: Every day | ORAL | Status: AC

## 2023-12-08 NOTE — Patient Instructions (Signed)
 Medication Instructions:   START TAKING ASPIRIN  81 MG BY MOUTH DAILY  *If you need a refill on your cardiac medications before your next appointment, please call your pharmacy*  Lab Work:  TOMORROW HERE AT OUR [facility] ON THE 3RD FLOOR AT [facility] [address]--NMR LIPOPROFILE AND LIPOPROTEIN A--PLEASE COME FASTING TO THIS LAB APPOINTMENT  If you have labs (blood work) drawn today and your tests are completely normal, you will receive your results only by: MyChart Message (if you have MyChart) OR A paper copy in the mail If you have any lab test that is abnormal or we need to change your treatment, we will call you to review the results.   Follow-Up:  AS NEEDED WITH DR. HILTY IN LIPID CLINIC

## 2023-12-08 NOTE — Progress Notes (Signed)
 LIPID CLINIC CONSULT NOTE  Chief Complaint:  Manage dyslipidemia  Primary Care Physician: Chrystal Lamarr RAMAN, MD  Primary Cardiologist:  None  HPI:  Angela Hickman is a 73 y.o. female who is being seen today for the evaluation of dyslipidemia at the request of Chrystal Lamarr RAMAN, *.  This a pleasant 73 year old female kindly referred for evaluation management of dyslipidemia.  She has a family history of heart disease including her father who had an MI in his 40s and ultimately died of a stroke.  He had high cholesterol as well.  She was noted recently to have elevated cholesterol that had been difficult to control on statin therapy.  She was on rosuvastatin 20 mg daily but recently increased to 40 mg daily.  Total cholesterol was 217, triglycerides 98, HDL 80 and LDL 121.  She had a coronary calcium score performed which was significantly elevated at 696, 93rd percentile for age and sex matched controls and did show aortic atherosclerosis (09/2023).  She was appropriately started on low-dose aspirin .  Overall she seems to be doing well denying chest pain or shortness of breath with exertion.  I am concerned because of her high cholesterol and suboptimal response to statin therapy that she may actually have a high LP(a).  Overall diet is fairly low in saturated fats.  She does get some regular exercise.  PMHx:  Past Medical History:  Diagnosis Date   Allergy    Anemia    Cataract    Depression    Hyperlipidemia    Osteopenia    Osteopenia     Past Surgical History:  Procedure Laterality Date   CESAREAN SECTION     x2   COLONOSCOPY     DILATION AND CURETTAGE OF UTERUS     3 times    FAMHx:  Family History  Problem Relation Age of Onset   Colon cancer Mother    DES usage Mother    Heart disease Father    Esophageal cancer Neg Hx    Stomach cancer Neg Hx    Rectal cancer Neg Hx     SOCHx:   reports that she has never smoked. She has never used smokeless  tobacco. She reports current alcohol use of about 6.0 standard drinks of alcohol per week. She reports that she does not use drugs.  ALLERGIES:  Allergies  Allergen Reactions   Sumycin [Tetracycline Hcl] Hives   Erythromycin Other (See Comments)    Gastric pain    ROS: Pertinent items noted in HPI and remainder of comprehensive ROS otherwise negative.  HOME MEDS: Current Outpatient Medications on File Prior to Visit  Medication Sig Dispense Refill   calcium carbonate 200 MG capsule Take 250 mg by mouth daily.     cetirizine (ZYRTEC) 10 MG chewable tablet Chew 10 mg by mouth as needed for allergies.     conjugated estrogens (PREMARIN) vaginal cream Place vaginally. Twice a week     fish oil-omega-3 fatty acids 1000 MG capsule Take 2 g by mouth daily.     fluticasone (FLONASE) 50 MCG/ACT nasal spray Place into both nostrils as needed for allergies or rhinitis.     ketotifen (ZADITOR) 0.025 % ophthalmic solution Place 1 drop into both eyes daily before breakfast.     Multiple Vitamins-Minerals (MULTIVITAMIN WITH MINERALS) tablet Take 1 tablet by mouth daily.     rosuvastatin (CRESTOR) 20 MG tablet Take 20 mg by mouth daily.     sertraline (ZOLOFT) 50 MG  tablet Take 50 mg by mouth daily.     No current facility-administered medications on file prior to visit.    LABS/IMAGING: No results found for this or any previous visit (from the past 48 hours). No results found.  LIPID PANEL: No results found for: CHOL, TRIG, HDL, CHOLHDL, VLDL, LDLCALC, LDLDIRECT  No results found for: LIPOA   WEIGHTS: Wt Readings from Last 3 Encounters:  12/08/23 128 lb 9.6 oz (58.3 kg)  03/22/22 135 lb (61.2 kg)  02/10/22 135 lb (61.2 kg)    VITALS: BP 94/68 (BP Location: Right Arm, Patient Position: Sitting, Cuff Size: Normal)   Pulse 79   Ht 5' 2 (1.575 m)   Wt 128 lb 9.6 oz (58.3 kg)   SpO2 99%   BMI 23.52 kg/m    EXAM: Deferred  EKG: Deferred  ASSESSMENT: Dyslipidemia, goal LDL less than 70 CAC score of 696, 93rd percentile (09/2023) Aortic atherosclerosis Family history of premature onset coronary disease  PLAN: 1.   Ms. Hickam has a high cholesterol and recently was placed on high intensity statin therapy.  She has a calcium score of close to 700 would suggest significant age advanced coronary disease and early heart disease history in her father.  I suspect she might have a high LP(a) as well and we will go ahead and check that.  She is already on high intensity statin therapy but if her repeat lipids after increasing the rosuvastatin are still not at target then I would advise PCSK9 inhibitor therapy such as Repatha, especially if her LP(a) is high.  He is again for the kind referral.  Vinie KYM Maxcy, MD, [facility], FNLA, FACP  [place]  [facility]  Medical Director of the Advanced Lipid Disorders &  Cardiovascular Risk Reduction Clinic Diplomate of the American Board of Clinical Lipidology Attending Cardiologist  Direct Dial: [phone]  Fax: [phone]  Website:  [email or web address] Vinie BROCKS Shalom Mcguiness 12/08/2023, 11:25 AM

## 2023-12-09 DIAGNOSIS — E7800 Pure hypercholesterolemia, unspecified: Secondary | ICD-10-CM | POA: Diagnosis not present

## 2023-12-09 DIAGNOSIS — Z79899 Other long term (current) drug therapy: Secondary | ICD-10-CM | POA: Diagnosis not present

## 2023-12-10 LAB — NMR, LIPOPROFILE
Cholesterol, Total: 205 mg/dL — ABNORMAL HIGH (ref 100–199)
HDL Particle Number: 41.3 umol/L (ref >=30.5)
HDL-C: 96 mg/dL (ref >39)
LDL Particle Number: 748 nmol/L (ref <1000)
LDL Size: 21.4 nm (ref >20.5)
LDL-C (NIH Calc): 95 mg/dL (ref 0–99)
LP-IR Score: <25 (ref <=45)
Small LDL Particle Number: <90 nmol/L (ref <=527)
Triglycerides: 82 mg/dL (ref 0–149)

## 2023-12-11 LAB — LIPOPROTEIN A (LPA): Lipoprotein (a): 417 nmol/L — ABNORMAL HIGH (ref <75.0)

## 2023-12-13 ENCOUNTER — Ambulatory Visit: Payer: Self-pay | Admitting: Internal Medicine

## 2023-12-13 DIAGNOSIS — E785 Hyperlipidemia, unspecified: Secondary | ICD-10-CM

## 2023-12-22 ENCOUNTER — Other Ambulatory Visit (HOSPITAL_COMMUNITY): Payer: Self-pay

## 2023-12-22 ENCOUNTER — Telehealth: Payer: Self-pay | Admitting: Pharmacy Technician

## 2023-12-22 NOTE — Telephone Encounter (Signed)
 Per results note  Pharmacy Patient Advocate Encounter   Received notification from RESULTS NOTE that prior authorization for REPATHA  is required/requested.   Insurance verification completed.   The patient is insured through [facility] .   Per test claim: PA required; PA submitted to above mentioned insurance via Latent Key/confirmation #/EOC [id number] Status is pending

## 2023-12-23 ENCOUNTER — Other Ambulatory Visit (HOSPITAL_COMMUNITY): Payer: Self-pay

## 2023-12-23 MED ORDER — REPATHA SURECLICK 140 MG/ML ~~LOC~~ SOAJ: 140.0000 mg | SUBCUTANEOUS | 3 refills | Status: AC

## 2023-12-23 NOTE — Telephone Encounter (Signed)
 Pharmacy Patient Advocate Encounter  Received notification from [facility] that Prior Authorization for repatha  has been APPROVED from 12/23/23 to 12/21/24. Ran test claim, Copay is $45.00. This test claim was processed through [facility]- copay amounts may vary at other pharmacies due to pharmacy/plan contracts, or as the patient moves through the different stages of their insurance plan.   PA #/Case ID/Reference #: [id number]   Hi, if this prescription can be sent in to her pharmacy now please. Thank you!

## 2023-12-23 NOTE — Addendum Note (Signed)
 Addended by: MARITZA SOR F on: 12/23/2023 10:44 AM   Modules accepted: Orders

## 2023-12-23 NOTE — Telephone Encounter (Signed)
 Spoke with patient and let her know the PA for Repatha  was approved for 1 year and her co-pay is $45.  She was okay with this and knows I have sent in her prescription to preferred pharmacy.

## 2023-12-30 ENCOUNTER — Institutional Professional Consult (permissible substitution) (HOSPITAL_BASED_OUTPATIENT_CLINIC_OR_DEPARTMENT_OTHER): Admitting: Internal Medicine

## 2024-01-04 ENCOUNTER — Encounter (HOSPITAL_BASED_OUTPATIENT_CLINIC_OR_DEPARTMENT_OTHER): Payer: Self-pay | Admitting: Internal Medicine

## 2024-01-04 NOTE — Addendum Note (Signed)
 Addended by: LORING ANDRIETTE HERO on: 01/04/2024 01:41 PM   Modules accepted: Orders

## 2024-02-07 DIAGNOSIS — E7800 Pure hypercholesterolemia, unspecified: Secondary | ICD-10-CM | POA: Diagnosis not present

## 2024-02-07 DIAGNOSIS — Z79899 Other long term (current) drug therapy: Secondary | ICD-10-CM | POA: Diagnosis not present

## 2024-02-07 DIAGNOSIS — Z0000 Encounter for general adult medical examination without abnormal findings: Secondary | ICD-10-CM | POA: Diagnosis not present

## 2024-02-07 DIAGNOSIS — E559 Vitamin D deficiency, unspecified: Secondary | ICD-10-CM | POA: Diagnosis not present

## 2024-02-07 DIAGNOSIS — M810 Age-related osteoporosis without current pathological fracture: Secondary | ICD-10-CM | POA: Diagnosis not present

## 2024-02-27 ENCOUNTER — Encounter: Payer: Self-pay | Admitting: *Deleted

## 2024-02-27 DIAGNOSIS — Z006 Encounter for examination for normal comparison and control in clinical research program: Secondary | ICD-10-CM

## 2024-02-27 NOTE — Research (Signed)
 Message left for Angela Hickman about pre-event research study. Encouraged her to call if she is interested in receiving more information about the study.

## 2024-03-04 ENCOUNTER — Encounter (HOSPITAL_BASED_OUTPATIENT_CLINIC_OR_DEPARTMENT_OTHER): Payer: Self-pay | Admitting: Internal Medicine

## 2024-03-05 MED ORDER — REPATHA SURECLICK 140 MG/ML ~~LOC~~ SOAJ: 140.0000 mg | SUBCUTANEOUS | 0 refills | Status: AC

## 2024-03-20 ENCOUNTER — Encounter (HOSPITAL_BASED_OUTPATIENT_CLINIC_OR_DEPARTMENT_OTHER): Payer: Self-pay | Admitting: Internal Medicine

## 2024-03-23 DIAGNOSIS — E785 Hyperlipidemia, unspecified: Secondary | ICD-10-CM | POA: Diagnosis not present

## 2024-03-26 LAB — NMR, LIPOPROFILE
Cholesterol, Total: 175 mg/dL (ref 100–199)
HDL Particle Number: 45.3 umol/L (ref >=30.5)
HDL-C: 112 mg/dL (ref >39)
LDL Particle Number: 346 nmol/L (ref <1000)
LDL Size: 21.4 nm (ref >20.5)
LDL-C (NIH Calc): 52 mg/dL (ref 0–99)
LP-IR Score: <25 (ref <=45)
Small LDL Particle Number: <90 nmol/L (ref <=527)
Triglycerides: 58 mg/dL (ref 0–149)

## 2024-03-26 LAB — LIPOPROTEIN A (LPA): Lipoprotein (a): 362.2 nmol/L — AB (ref <75.0)

## 2024-03-28 ENCOUNTER — Encounter: Payer: Self-pay | Admitting: *Deleted

## 2024-03-28 DIAGNOSIS — Z006 Encounter for examination for normal comparison and control in clinical research program: Secondary | ICD-10-CM

## 2024-03-28 NOTE — Research (Signed)
 Message left for Ms Berndt to call me back about pre-event research study.
